# Patient Record
Sex: Male | Born: 1950 | Race: White | Hispanic: No | Smoking: Current every day smoker
Health system: Southern US, Community
[De-identification: ages and names within clinical notes are randomized; demographics above are authoritative.]

## PROBLEM LIST (undated history)

## (undated) HISTORY — PX: CHOLECYSTECTOMY: SHX55

## (undated) HISTORY — PX: COLON SURGERY: SHX602

---

## 2004-08-21 ENCOUNTER — Ambulatory Visit: Payer: Self-pay | Admitting: Pain Medicine

## 2005-08-17 ENCOUNTER — Emergency Department: Payer: Self-pay | Admitting: Emergency Medicine

## 2005-10-20 ENCOUNTER — Encounter: Payer: Self-pay | Admitting: Internal Medicine

## 2005-11-01 ENCOUNTER — Encounter: Payer: Self-pay | Admitting: Internal Medicine

## 2005-12-21 ENCOUNTER — Other Ambulatory Visit: Payer: Self-pay

## 2005-12-21 ENCOUNTER — Ambulatory Visit: Payer: Self-pay | Admitting: Gastroenterology

## 2006-12-08 ENCOUNTER — Ambulatory Visit: Payer: Self-pay | Admitting: Gastroenterology

## 2007-02-09 ENCOUNTER — Ambulatory Visit: Payer: Self-pay | Admitting: Gastroenterology

## 2007-12-28 IMAGING — CR DG CHEST 2V
1 series · 4 of 4 positions shown · non-contrast
Comparison: none

REASON FOR EXAM: Shortness of breath, chest pain
COMMENTS:

PROCEDURE:     DXR - DXR CHEST PA (OR AP) AND LATERAL  - December 21, 2005  [DATE]
RESULT:     The lungs are clear. The cardiovascular structures are
unremarkable.

[Series 1: view not recorded · 0.17mm/px · 4 of 4 slices shown]
[im 1/4]
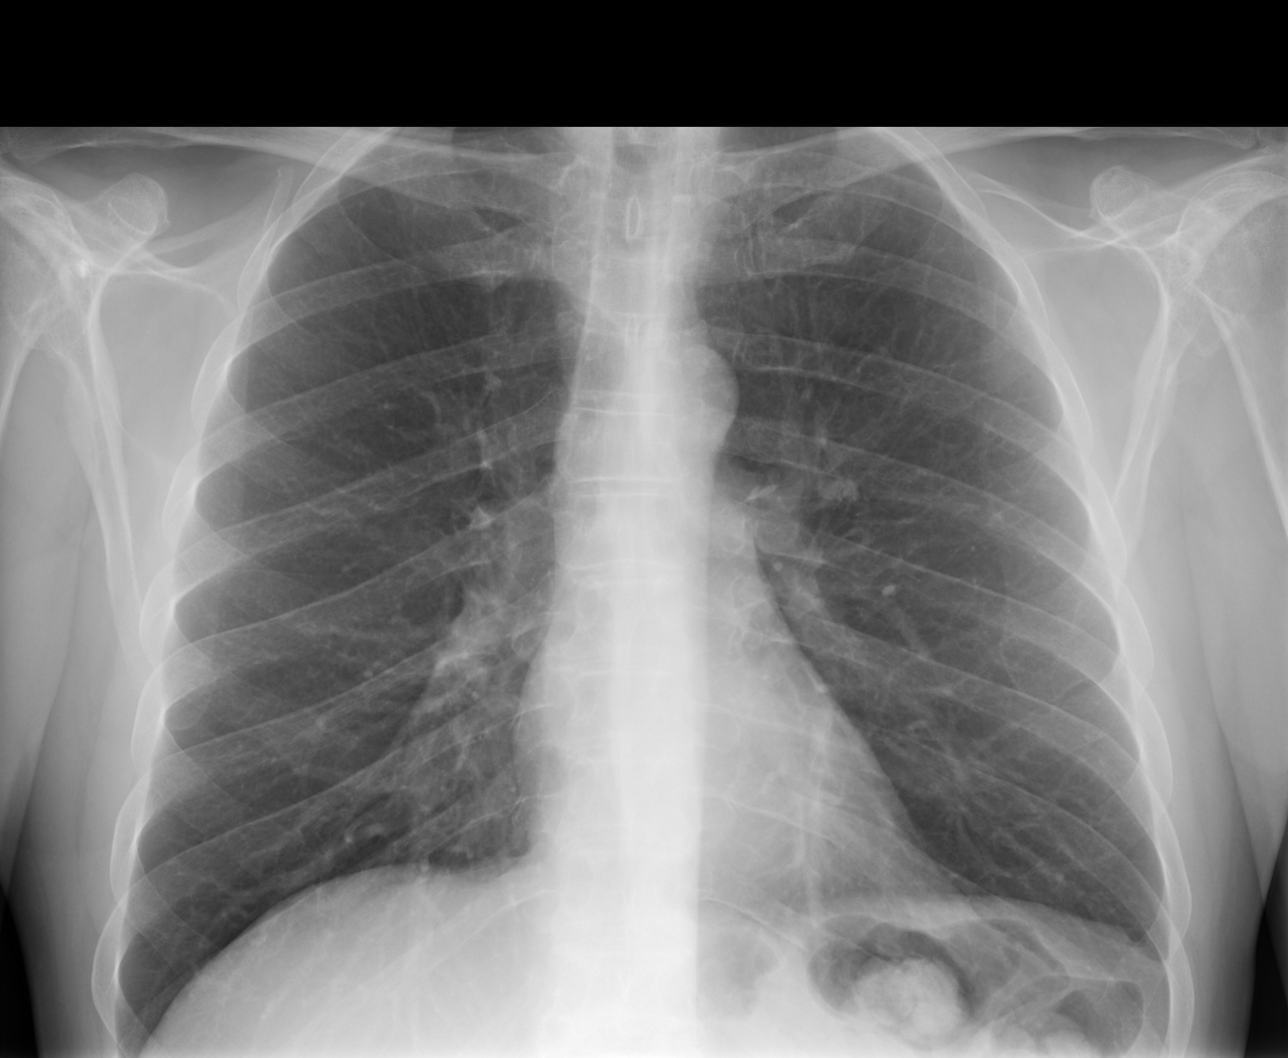
[im 2/4]
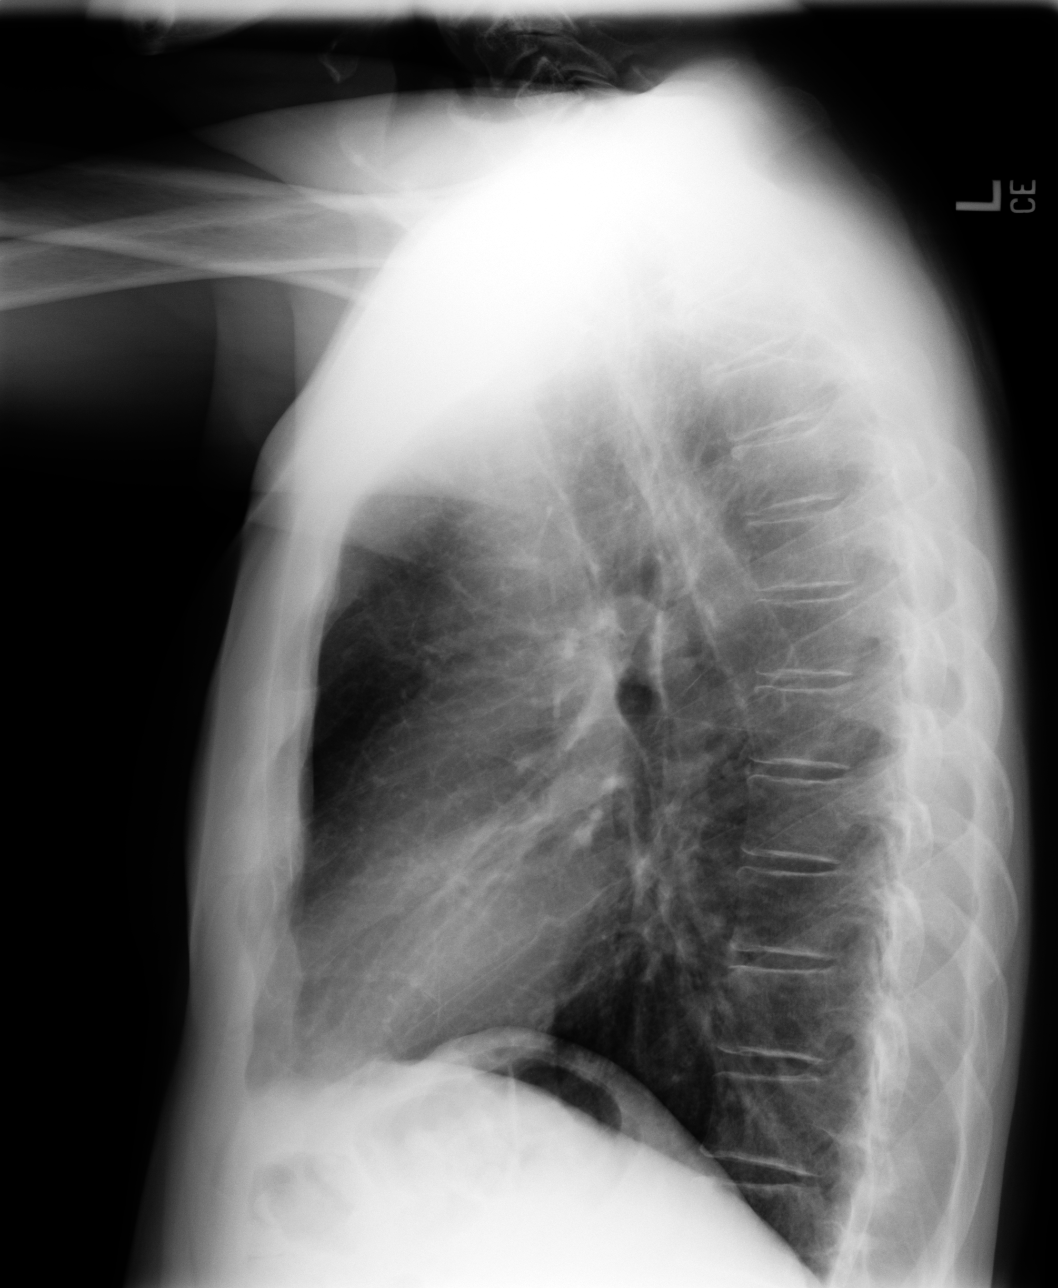
[im 3/4]
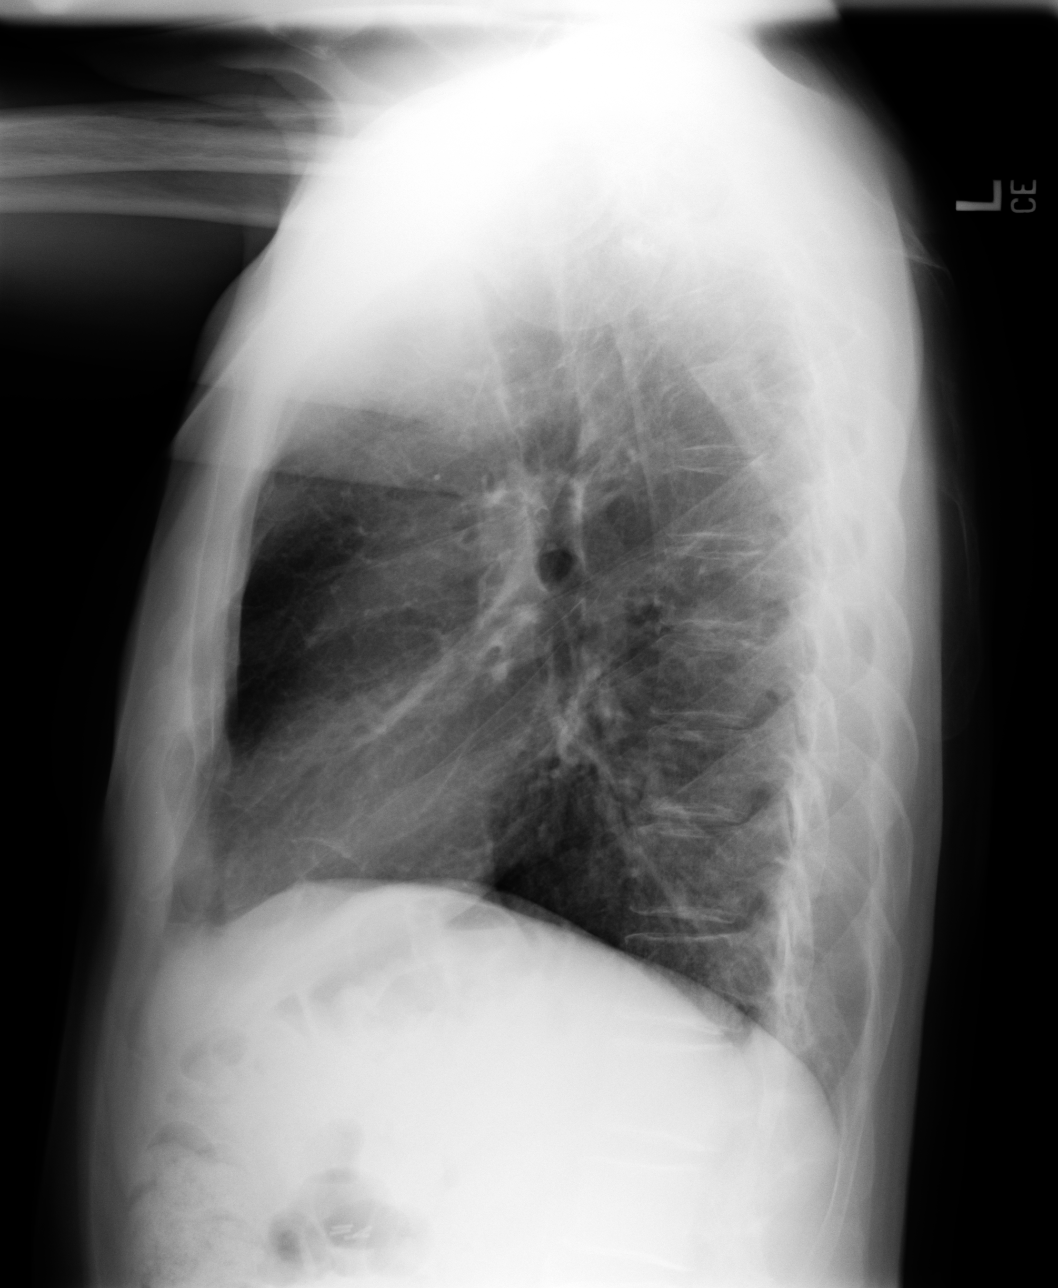
[im 4/4]
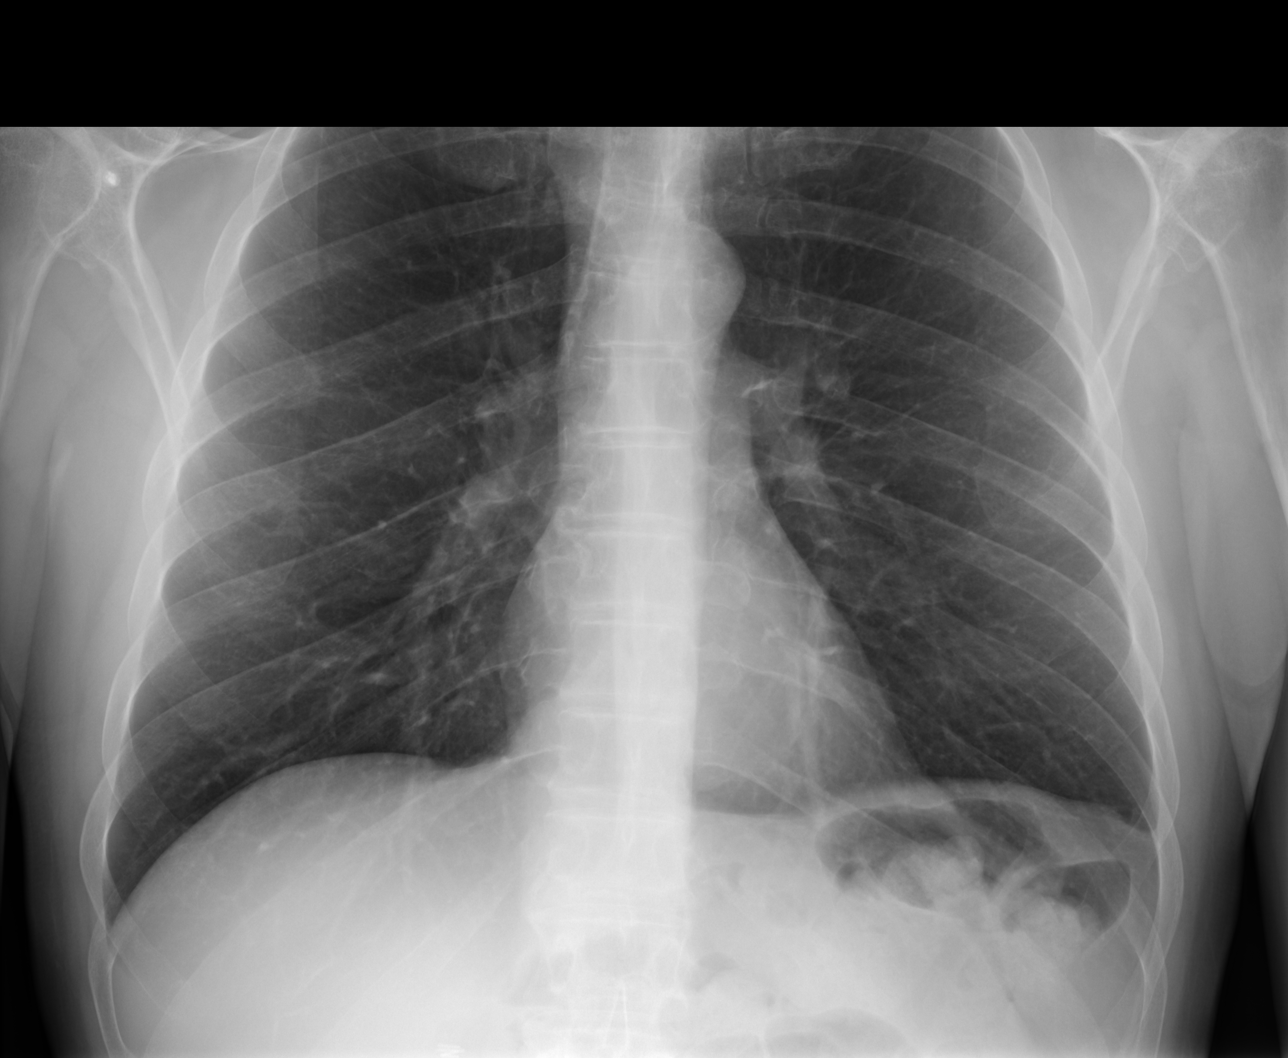

[4 of 4 positions shown; findings below may reference images not displayed]

IMPRESSION: No acute cardiopulmonary disease.

## 2010-07-09 ENCOUNTER — Ambulatory Visit: Payer: Self-pay | Admitting: Gastroenterology

## 2013-11-01 ENCOUNTER — Ambulatory Visit: Payer: Self-pay | Admitting: Gastroenterology

## 2014-04-15 ENCOUNTER — Emergency Department: Payer: Self-pay | Admitting: Student

## 2015-06-01 ENCOUNTER — Emergency Department
Admission: EM | Admit: 2015-06-01 | Discharge: 2015-06-01 | Disposition: A | Discharge status: Home / Self Care | Payer: Medicare Other | Attending: Emergency Medicine | Admitting: Emergency Medicine

## 2015-06-01 ENCOUNTER — Encounter: Payer: Self-pay | Admitting: Emergency Medicine

## 2015-06-01 DIAGNOSIS — R110 Nausea: Secondary | ICD-10-CM | POA: Diagnosis not present

## 2015-06-01 DIAGNOSIS — R197 Diarrhea, unspecified: Secondary | ICD-10-CM | POA: Diagnosis present

## 2015-06-01 DIAGNOSIS — F17200 Nicotine dependence, unspecified, uncomplicated: Secondary | ICD-10-CM | POA: Insufficient documentation

## 2015-06-01 LAB — CBC
HEMATOCRIT: 40.9 % (ref 40.0–52.0)
HEMOGLOBIN: 14.2 g/dL (ref 13.0–18.0)
MCH: 33.6 pg (ref 26.0–34.0)
MCHC: 34.7 g/dL (ref 32.0–36.0)
MCV: 96.8 fL (ref 80.0–100.0)
Platelets: 229 10*3/uL (ref 150–440)
RBC: 4.23 MIL/uL — ABNORMAL LOW (ref 4.40–5.90)
RDW: 12.8 % (ref 11.5–14.5)
WBC: 7.6 10*3/uL (ref 3.8–10.6)

## 2015-06-01 LAB — COMPREHENSIVE METABOLIC PANEL
ALBUMIN: 4.3 g/dL (ref 3.5–5.0)
ALT: 23 U/L (ref 17–63)
ANION GAP: 11 (ref 5–15)
AST: 21 U/L (ref 15–41)
Alkaline Phosphatase: 59 U/L (ref 38–126)
BILIRUBIN TOTAL: 0.9 mg/dL (ref 0.3–1.2)
BUN: 9 mg/dL (ref 6–20)
CO2: 22 mmol/L (ref 22–32)
Calcium: 9.8 mg/dL (ref 8.9–10.3)
Chloride: 107 mmol/L (ref 101–111)
Creatinine, Ser: 0.82 mg/dL (ref 0.61–1.24)
GFR calc Af Amer: >60 mL/min (ref >60)
GFR calc non Af Amer: >60 mL/min (ref >60)
Glucose, Bld: 140 mg/dL — ABNORMAL HIGH (ref 65–99)
POTASSIUM: 3.9 mmol/L (ref 3.5–5.1)
SODIUM: 140 mmol/L (ref 135–145)
TOTAL PROTEIN: 7.9 g/dL (ref 6.5–8.1)

## 2015-06-01 LAB — LIPASE, BLOOD: LIPASE: 53 U/L — AB (ref 11–51)

## 2015-06-01 LAB — TROPONIN I: Troponin I: <0.03 ng/mL (ref <0.031)

## 2015-06-01 MED ORDER — ONDANSETRON 4 MG PO TBDP
4.0000 mg | ORAL_TABLET | Freq: Once | ORAL | Status: AC | PRN
  Administered 2015-06-01: 4 mg via ORAL
  Filled 2015-06-01: qty 1

## 2015-06-01 NOTE — ED Provider Notes (Signed)
[facility] Emergency Department Provider Note   ____________________________________________  Time seen: Approximately 8:55 AM  I have reviewed the triage vital signs and the nursing notes.   HISTORY  Chief Complaint Diarrhea    HPI Walter Behrle Berrong Montez Hageman. is a 65 y.o. male patient told triage nurse that he had diarrhea 3 times both something funny in the duct was somewhat nauseated. Patient tells me he really didn't have diarrhea he was spraying bug spray and some Lasix that were attacking him on the outside of his house. He sprayed it for 5 and afternoon again at 10 or 11 he thinks someway blown back in his face. He said he was nauseated and thinks maybe the spray got in the duct somehow that might be what he smelled. He has not had a headache at all and he feels fine at present. Patient also denies any episodes of chest pain neck pain shortness of breath or sweating.   History reviewed. No pertinent past medical history.  There are no active problems to display for this patient.   Past Surgical History  Procedure Laterality Date  . Cholecystectomy    . Colon surgery      No current outpatient prescriptions on file.  Allergies Review of patient's allergies indicates no known allergies.  No family history on file.  Social History Social History  Substance Use Topics  . Smoking status: Current Every Day Smoker -- 1.50 packs/day  . Smokeless tobacco: None  . Alcohol Use: No    Review of Systems Constitutional: No fever/chills Eyes: No visual changes. ENT: No sore throat. Cardiovascular: Denies chest pain. Respiratory: Denies shortness of breath. Gastrointestinal: No abdominal pain.   nausea, no vomiting.  No diarrhea.  No constipation. Genitourinary: Negative for dysuria. Musculoskeletal: Negative for back pain. Skin: Negative for rash. Neurological: Negative for headaches, focal weakness or numbness.  10-point ROS otherwise  negative.  ____________________________________________   PHYSICAL EXAM:  VITAL SIGNS: ED Triage Vitals  Enc Vitals Group     BP 06/01/15 0634 98/70 mmHg     Pulse Rate 06/01/15 0634 54     Resp 06/01/15 0634 23     Temp 06/01/15 0634 98.4 F (36.9 C)     Temp Source 06/01/15 0634 Oral     SpO2 06/01/15 0634 97 %     Weight 06/01/15 0634 174 lb (78.926 kg)     Height 06/01/15 0634 6' (1.829 m)     Head Cir --      Peak Flow --      Pain Score 06/01/15 0637 0     Pain Loc --      Pain Edu? --      Excl. in GC? --     Constitutional: Alert and oriented. Well appearing and in no acute distress. Eyes: Conjunctivae are normal. PERRL. EOMI. Head: Atraumatic. Nose: No congestion/rhinnorhea. Mouth/Throat: Mucous membranes are moist.  Oropharynx non-erythematous. Neck: No stridor.   }Cardiovascular: Normal rate, regular rhythm. Grossly normal heart sounds.  Good peripheral circulation. Respiratory: Normal respiratory effort.  No retractions. Lungs CTAB. Gastrointestinal: Soft and nontender. No distention. No abdominal bruits. No CVA tenderness. Musculoskeletal: No lower extremity tenderness nor edema.  No joint effusions. Neurologic:  Normal speech and language. No gross focal neurologic deficits are appreciated. No gait instability. Skin:  Skin is warm, dry and intact. No rash noted.   ____________________________________________   LABS (all labs ordered are listed, but only abnormal results are displayed)  Labs Reviewed  LIPASE, BLOOD - Abnormal; Notable for the following:    Lipase 53 (*)    All other components within normal limits  COMPREHENSIVE METABOLIC PANEL - Abnormal; Notable for the following:    Glucose, Bld 140 (*)    All other components within normal limits  CBC - Abnormal; Notable for the following:    RBC 4.23 (*)    All other components within normal limits  TROPONIN I  URINALYSIS COMPLETEWITH MICROSCOPIC ([facility] ONLY)    ____________________________________________  EKG  EKG read and interpreted by me shows normal sinus rhythm at a rate of 78 normal axis he does have diffuse ST-T wave flattening which is new from an old EKG he had from 2007. ____________________________________________  RADIOLOGY   ____________________________________________   PROCEDURES  Patient continues to insist that he feels well and wants to go home. And after the troponin comes back negative he says he now wants his pain medicine he only takes or he wants to go home. As he is still feeling fine and has no further symptoms at all his lab work is essentially negative I will let him go. He does not complain of any dysuria so I will not insist on him providing urine specimen before dischare.  ____________________________________________   INITIAL IMPRESSION / ASSESSMENT AND PLAN / ED COURSE  Pertinent labs & imaging results that were available during my care of the patient were reviewed by me and considered in my medical decision making (see chart for details).   ____________________________________________   FINAL CLINICAL IMPRESSION(S) / ED DIAGNOSES  Final diagnoses:  Nausea      NEW MEDICATIONS STARTED DURING THIS VISIT:  New Prescriptions   No medications on file     Note:  This document was prepared using Dragon voice recognition software and may include unintentional dictation errors.    Arnaldo NatalPaul F Amelita Risinger, MD 06/01/15 [phone]

## 2015-06-01 NOTE — Discharge Instructions (Signed)
Nausea and Vomiting Nausea means you feel sick to your stomach. Throwing up (vomiting) is a reflex where stomach contents come out of your mouth. HOME CARE   Take medicine as told by your doctor.  Do not force yourself to eat. However, you do need to drink fluids.  If you feel like eating, eat a normal diet as told by your doctor.  Eat rice, wheat, potatoes, bread, lean meats, yogurt, fruits, and vegetables.  Avoid high-fat foods.  Drink enough fluids to keep your pee (urine) clear or pale yellow.  Ask your doctor how to replace body fluid losses (rehydrate). Signs of body fluid loss (dehydration) include:  Feeling very thirsty.  Dry lips and mouth.  Feeling dizzy.  Dark pee.  Peeing less than normal.  Feeling confused.  Fast breathing or heart rate. GET HELP RIGHT AWAY IF:   You have blood in your throw up.  You have black or bloody poop (stool).  You have a bad headache or stiff neck.  You feel confused.  You have bad belly (abdominal) pain.  You have chest pain or trouble breathing.  You do not pee at least once every 8 hours.  You have cold, clammy skin.  You keep throwing up after 24 to 48 hours.  You have a fever. MAKE SURE YOU:   Understand these instructions.  Will watch your condition.  Will get help right away if you are not doing well or get worse.   This information is not intended to replace advice given to you by your health care provider. Make sure you discuss any questions you have with your health care provider.   Document Released: 07/07/2007 Document Revised: 04/12/2011 Document Reviewed: 06/19/2010 Elsevier Interactive Patient Education 2016 [employer]   Please return if you're worse. Please follow-up with your regular doctor.

## 2015-06-01 NOTE — ED Notes (Signed)
[facility] reports that pt came from home with c/o "smelling something in the ducts last night". Pt reports having diarrhea x3 and nausea with a metallic taste in his mouth. Pt is ambulaotyr, a/o, and with NAD noted at this time.

## 2016-04-21 IMAGING — CT CT ANGIO CHEST
2 of 6 series · 18 of 36 positions shown · IV contrast (APPLIED)
Comparison: Chest radiograph performed earlier today at [DATE] p.m.

CLINICAL DATA: Acute onset of right flank pain and fever. Elevated
D-dimer. Initial encounter.

EXAM:
CT ANGIOGRAPHY CHEST WITH CONTRAST
TECHNIQUE: Multidetector CT imaging of the chest was performed using the
standard protocol during bolus administration of intravenous
contrast. Multiplanar CT image reconstructions and MIPs were
obtained to evaluate the vascular anatomy.
CONTRAST:  75 mL of Omnipaque 300 IV contrast

[Series 5: pe 1.0 thins · axial · 0.82mm/px · z∈[-331,-36]mm · 17 of 333 slices shown]
[im 19/333  lung]
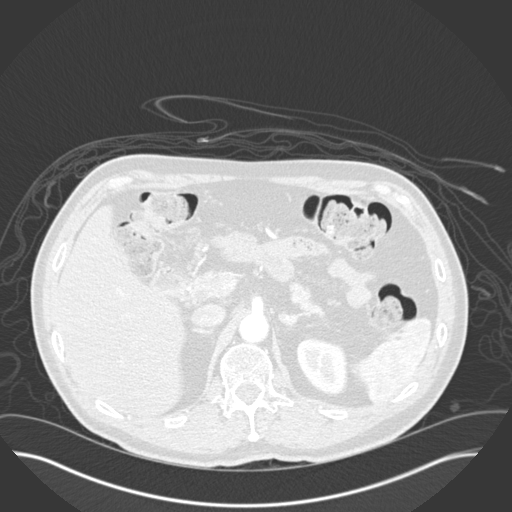
[im 37/333  mediastinal]
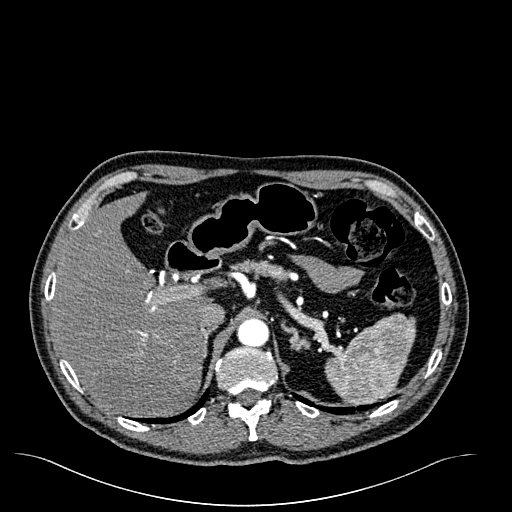
[im 56/333  lung]
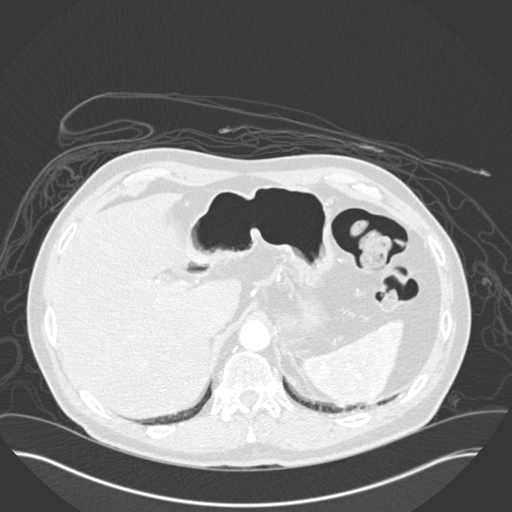
[im 74/333  mediastinal]
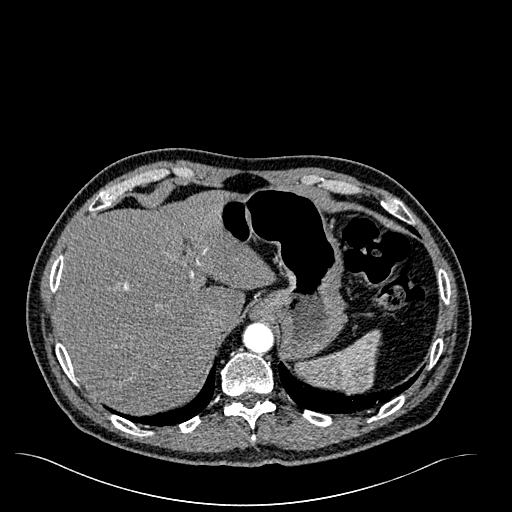
[im 93/333  lung]
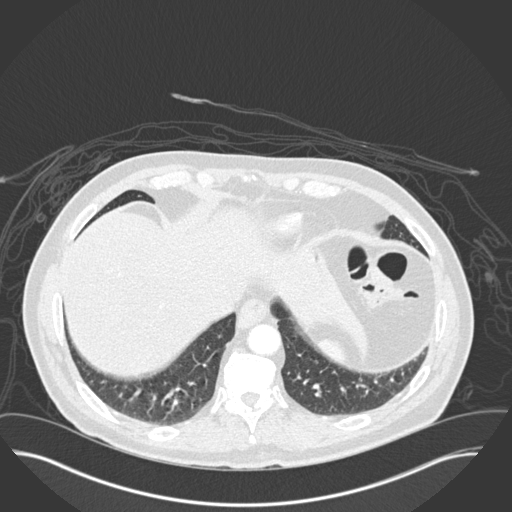
[im 111/333  mediastinal]
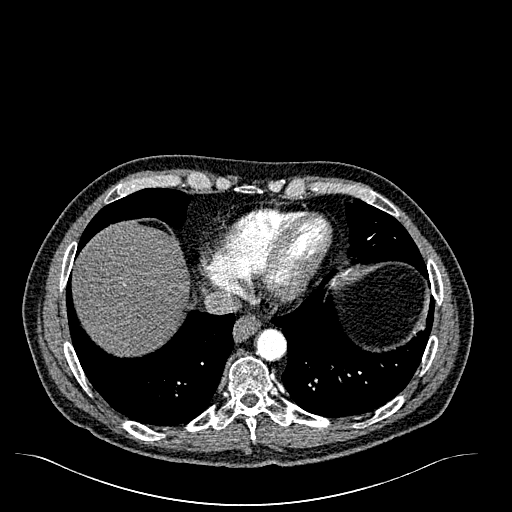
[im 130/333  lung]
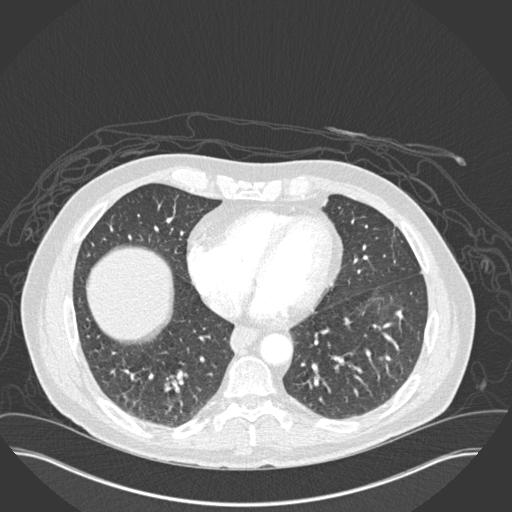
[im 148/333  mediastinal]
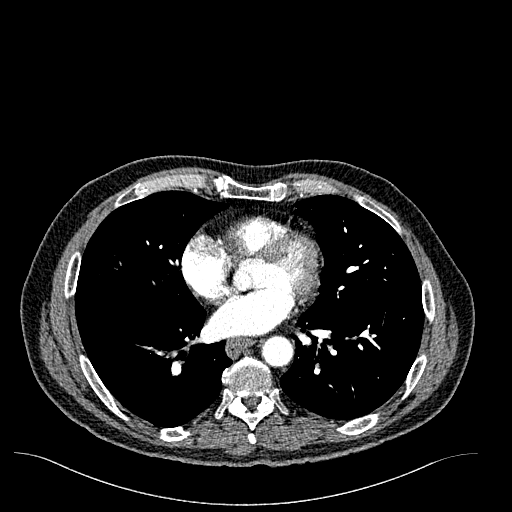
[im 167/333  lung]
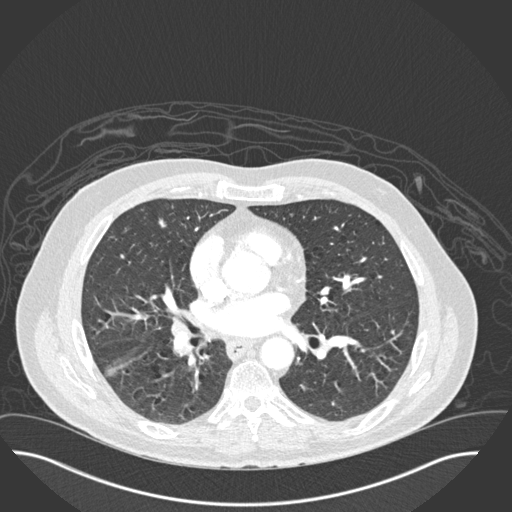
[im 185/333  mediastinal]
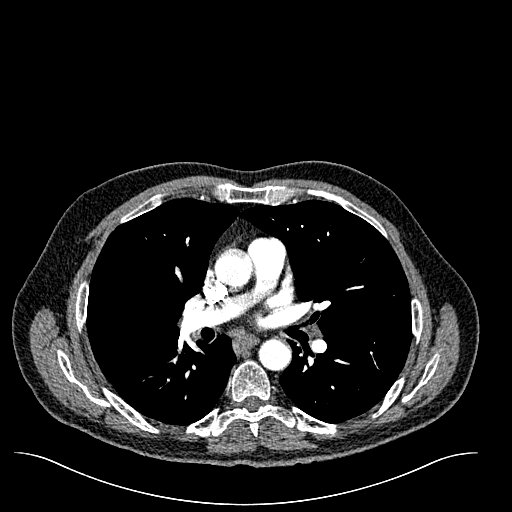
[im 203/333  lung]
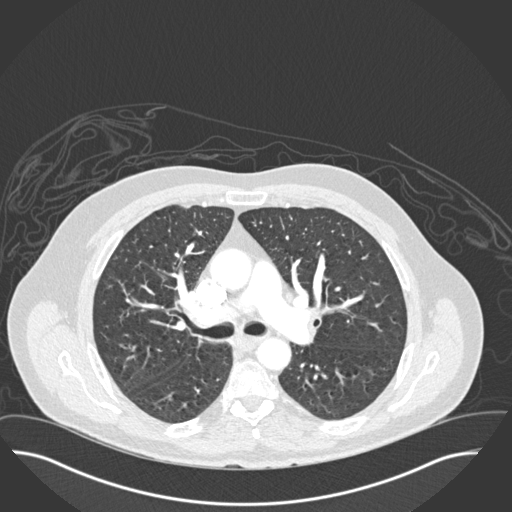
[im 222/333  mediastinal]
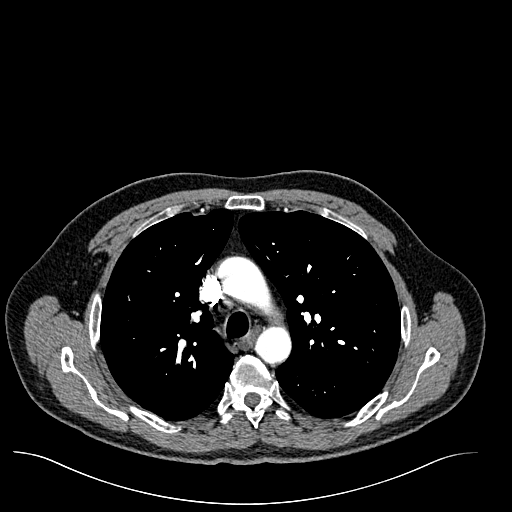
[im 240/333  lung]
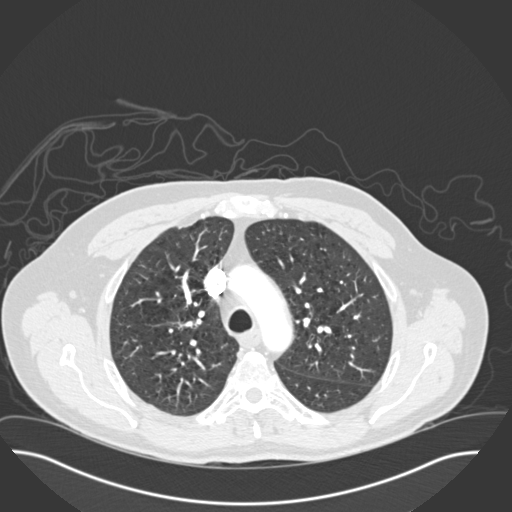
[im 259/333  mediastinal]
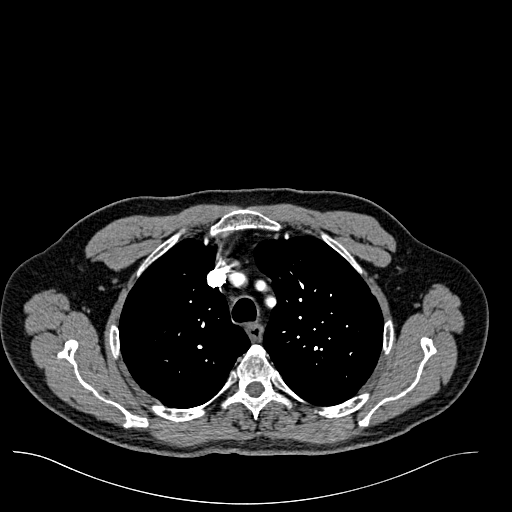
[im 277/333  lung]
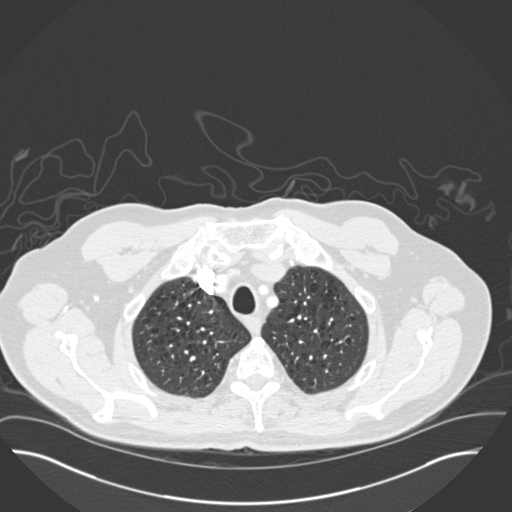
[im 296/333  mediastinal]
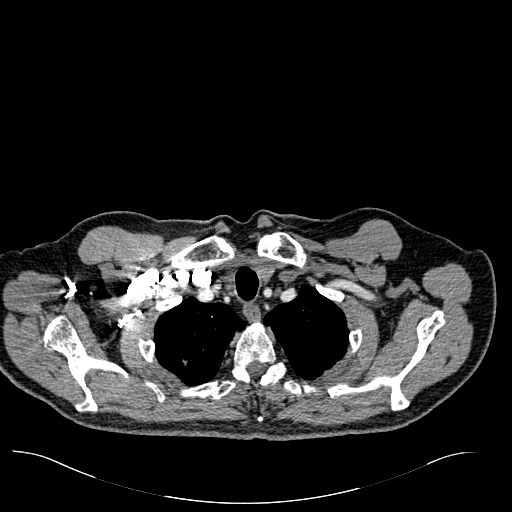
[im 314/333  lung]
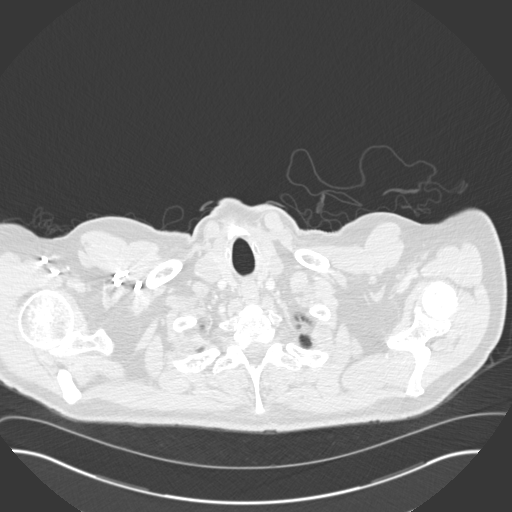

[Series 7: cor pe 2.0 mpr · coronal · 0.66mm/px · 1 of 128 slices shown]
[im 64/128  mediastinal]
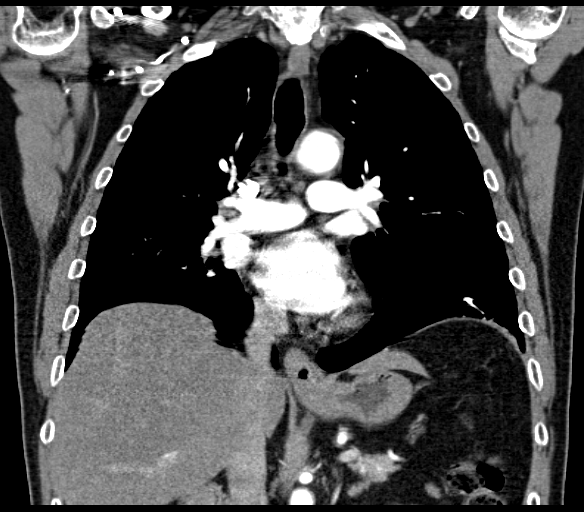

[18 of 36 positions shown; findings below may reference images not displayed]

FINDINGS: There is no evidence of pulmonary embolus.

There is a 9 mm nodule at the right lung apex (image 18 of 167),
with associated spiculation, raising concern for malignancy.
Underlying emphysematous change is noted bilaterally, most prominent
at the upper lung lobes. Minimal scarring is seen at the left lung
base. Mild scarring is also noted within the right lower lobe.

The lungs are otherwise grossly clear. No focal consolidation,
pleural effusion or pneumothorax is seen.

Scattered mediastinal and hilar nodes remain normal in size. No
mediastinal lymphadenopathy is seen. No pericardial effusion is
identified. The great vessels are unremarkable in appearance. Mild
diffuse esophageal wall thickening is nonspecific. Would correlate
for evidence of esophagitis. No axillary lymphadenopathy is seen.
The thyroid gland is unremarkable in appearance.

The visualized portions of the liver and spleen are unremarkable.
The patient is status post cholecystectomy, with clips noted along
the gallbladder fossa. The visualized portions of the pancreas,
adrenal glands and kidneys are grossly unremarkable.

No acute osseous abnormalities are seen.

Review of the MIP images confirms the above findings.
IMPRESSION: 1. No evidence of pulmonary embolus.
2. 9 mm nodule at the right lung apex, with associated spiculation.
This raises concern for malignancy. Would recommend surgical
consultation for biopsy; alternatively, PET/CT could be considered
for further evaluation.
3. Mild bibasilar scarring noted.
4. Mild diffuse esophageal wall thickening, nonspecific in
appearance. Would correlate for evidence of esophagitis.

## 2016-04-21 IMAGING — CR DG ABDOMEN 2V
1 series · 4 of 4 positions shown · non-contrast
Comparison: None.

CLINICAL DATA: Acute onset of fever and epigastric and right flank
pain. Initial encounter.

EXAM:
ABDOMEN - 2 VIEW

[Series 1: dxr abdomen 2 v flat and erect · 0.14mm/px · 4 of 4 slices shown]
[im 1/4]
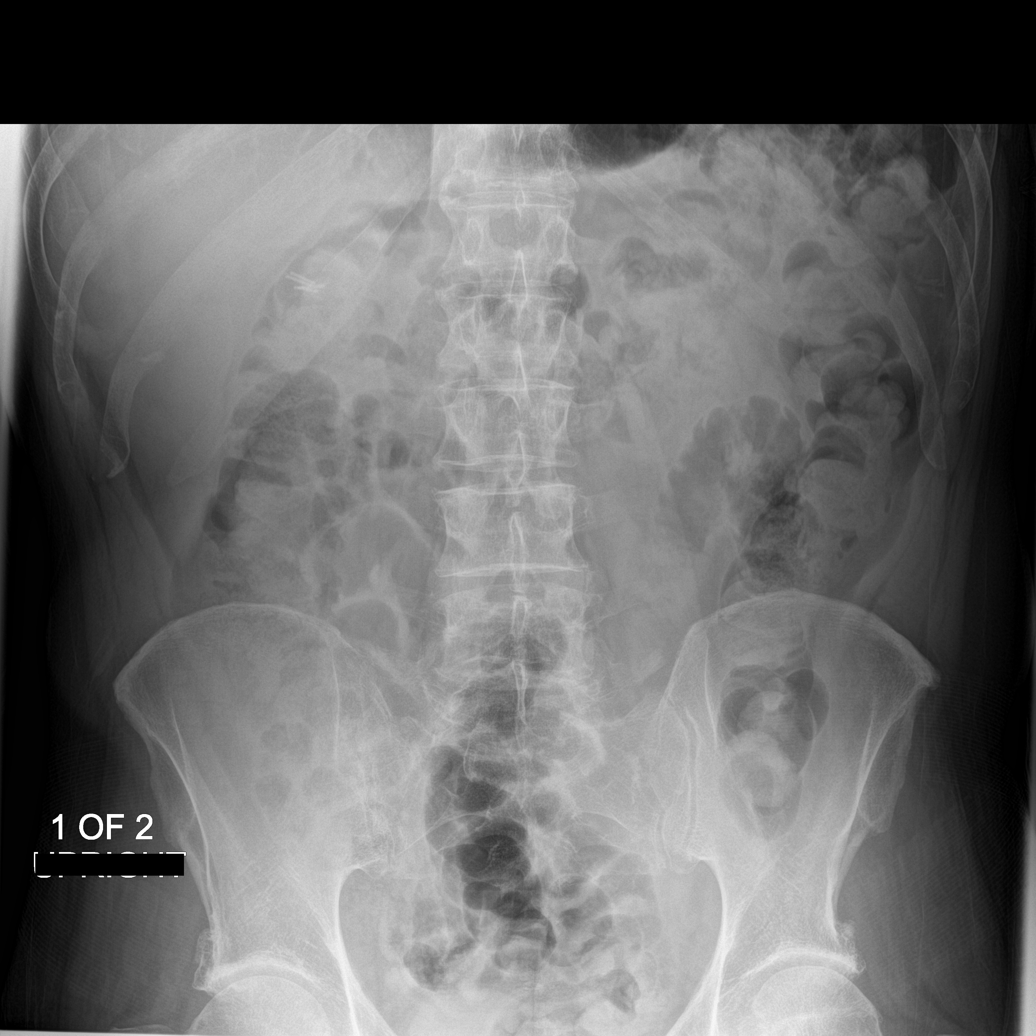
[im 2/4]
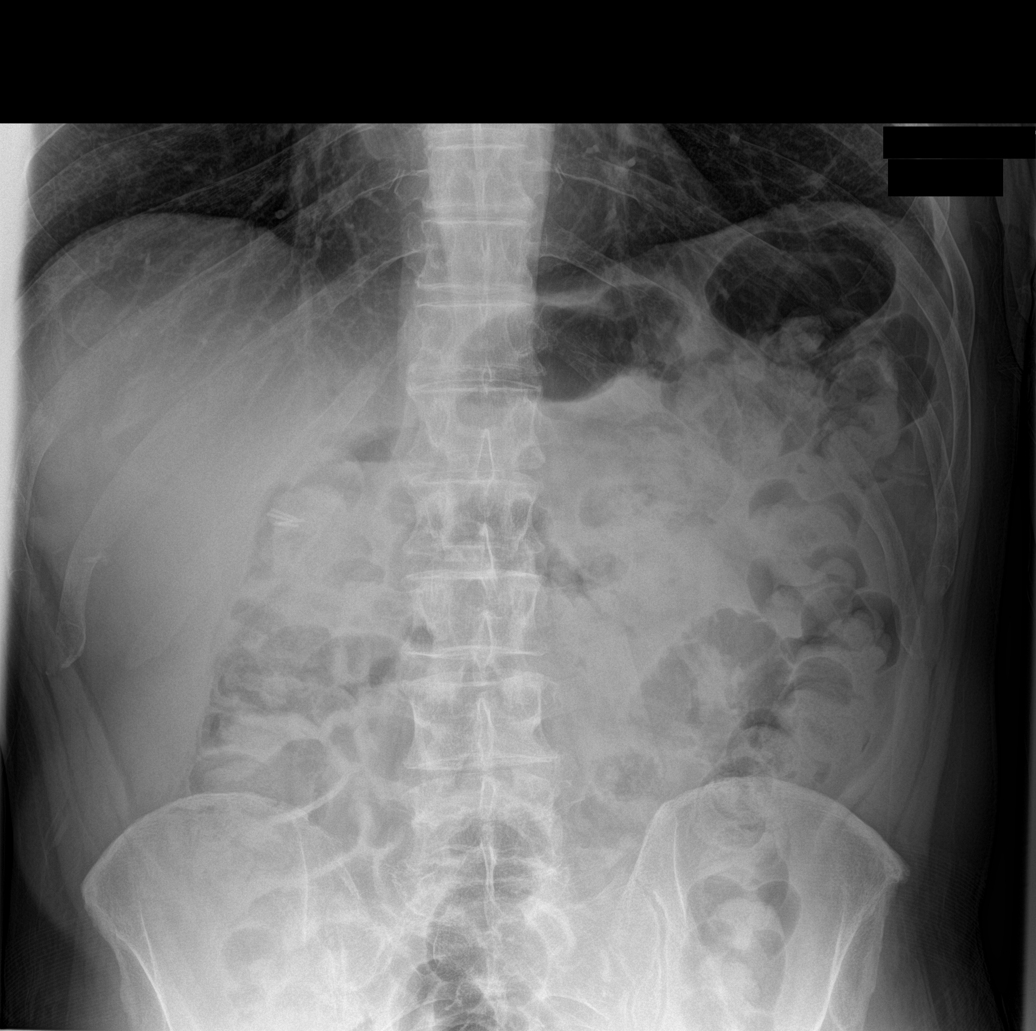
[im 3/4]
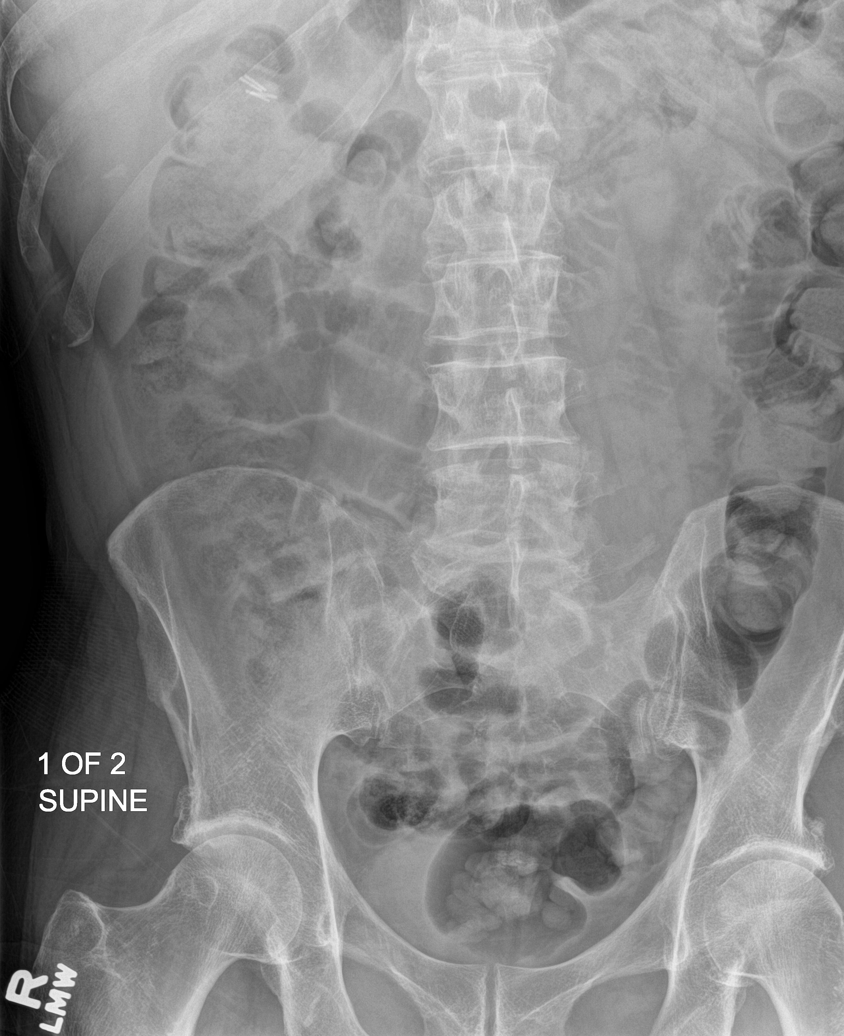
[im 4/4]
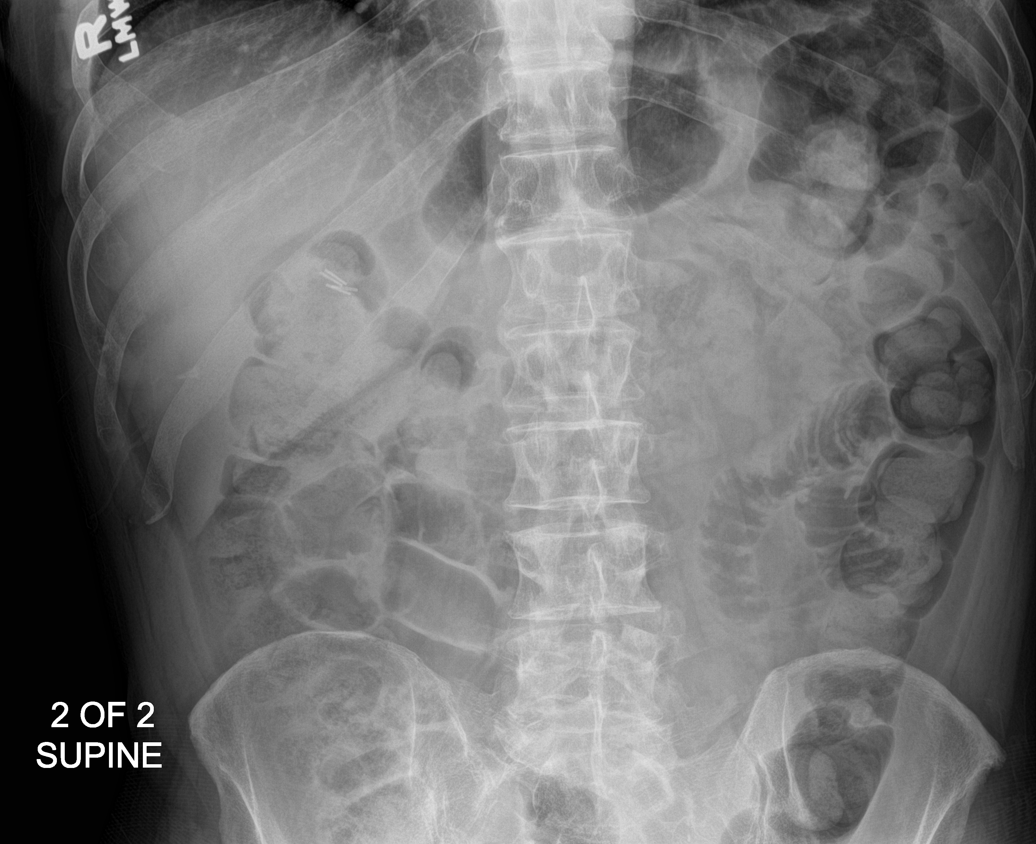

[4 of 4 positions shown; findings below may reference images not displayed]

FINDINGS: The visualized bowel gas pattern is unremarkable. Scattered air and
stool filled loops of colon are seen; no abnormal dilatation of
small bowel loops is seen to suggest small bowel obstruction. No
free intra-abdominal air is identified on the provided upright view.
Clips are noted within the right upper quadrant, reflecting prior
cholecystectomy.

The visualized osseous structures are within normal limits; the
sacroiliac joints are unremarkable in appearance. The visualized
lung bases are essentially clear.
IMPRESSION: Unremarkable bowel gas pattern; no free intra-abdominal air seen.
Small to moderate amount of stool noted in the colon.

## 2018-12-03 DEATH — deceased
